# Patient Record
Sex: Male | Born: 1975 | Race: Black or African American | Hispanic: No | Marital: Single | Smoking: Never smoker
Health system: Southern US, Community
[De-identification: ages and names within clinical notes are randomized; demographics above are authoritative.]

---

## 2001-07-22 ENCOUNTER — Emergency Department (HOSPITAL_COMMUNITY): Admission: AC | Admit: 2001-07-22 | Discharge: 2001-07-22 | Payer: Self-pay

## 2002-06-25 ENCOUNTER — Emergency Department (HOSPITAL_COMMUNITY): Admission: EM | Admit: 2002-06-25 | Discharge: 2002-06-25 | Payer: Self-pay | Admitting: Emergency Medicine

## 2002-07-06 ENCOUNTER — Encounter: Payer: Self-pay | Admitting: Emergency Medicine

## 2002-07-06 ENCOUNTER — Emergency Department (HOSPITAL_COMMUNITY): Admission: EM | Admit: 2002-07-06 | Discharge: 2002-07-06 | Payer: Self-pay | Admitting: Emergency Medicine

## 2005-03-30 ENCOUNTER — Emergency Department (HOSPITAL_COMMUNITY): Admission: EM | Admit: 2005-03-30 | Discharge: 2005-03-30 | Payer: Self-pay | Admitting: *Deleted

## 2007-02-07 ENCOUNTER — Emergency Department (HOSPITAL_COMMUNITY): Admission: EM | Admit: 2007-02-07 | Discharge: 2007-02-07 | Payer: Self-pay | Admitting: Emergency Medicine

## 2008-04-30 ENCOUNTER — Emergency Department (HOSPITAL_COMMUNITY): Admission: EM | Admit: 2008-04-30 | Discharge: 2008-04-30 | Payer: Self-pay | Admitting: Emergency Medicine

## 2008-05-24 ENCOUNTER — Emergency Department (HOSPITAL_COMMUNITY): Admission: EM | Admit: 2008-05-24 | Discharge: 2008-05-24 | Payer: Self-pay | Admitting: Emergency Medicine

## 2008-09-21 IMAGING — CR DG KNEE COMPLETE 4+V*L*
4 series · 4 of 4 positions shown · non-contrast
Comparison: 04/30/2008

CLINICAL DATA: Knee pain.

LEFT KNEE - COMPLETE 4+ VIEW

[t knee ap left]
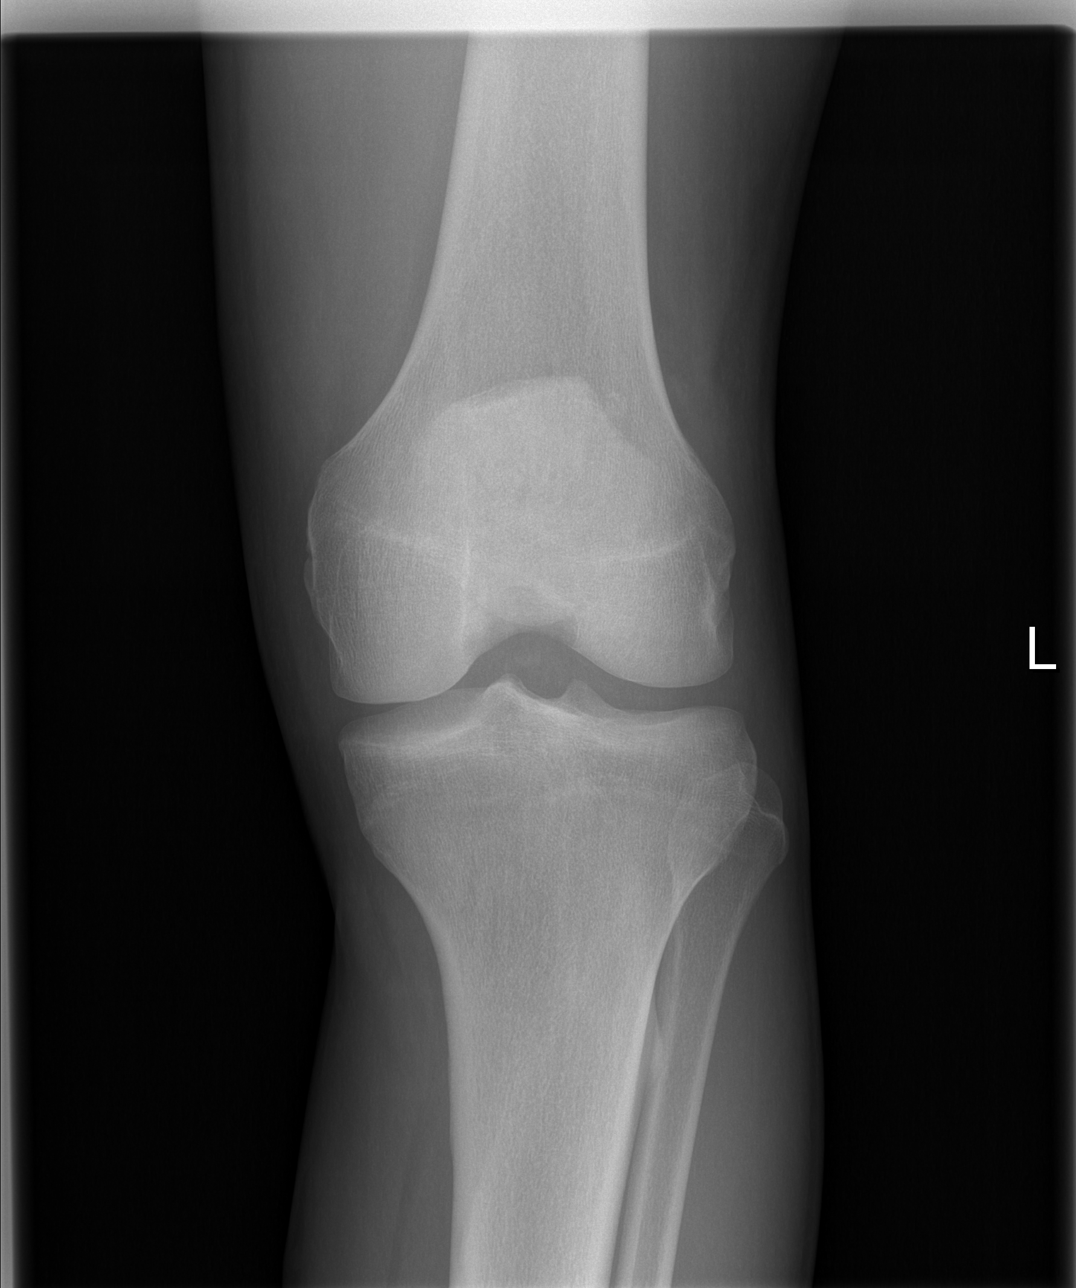

[t knee oblique left (1 of 2)]
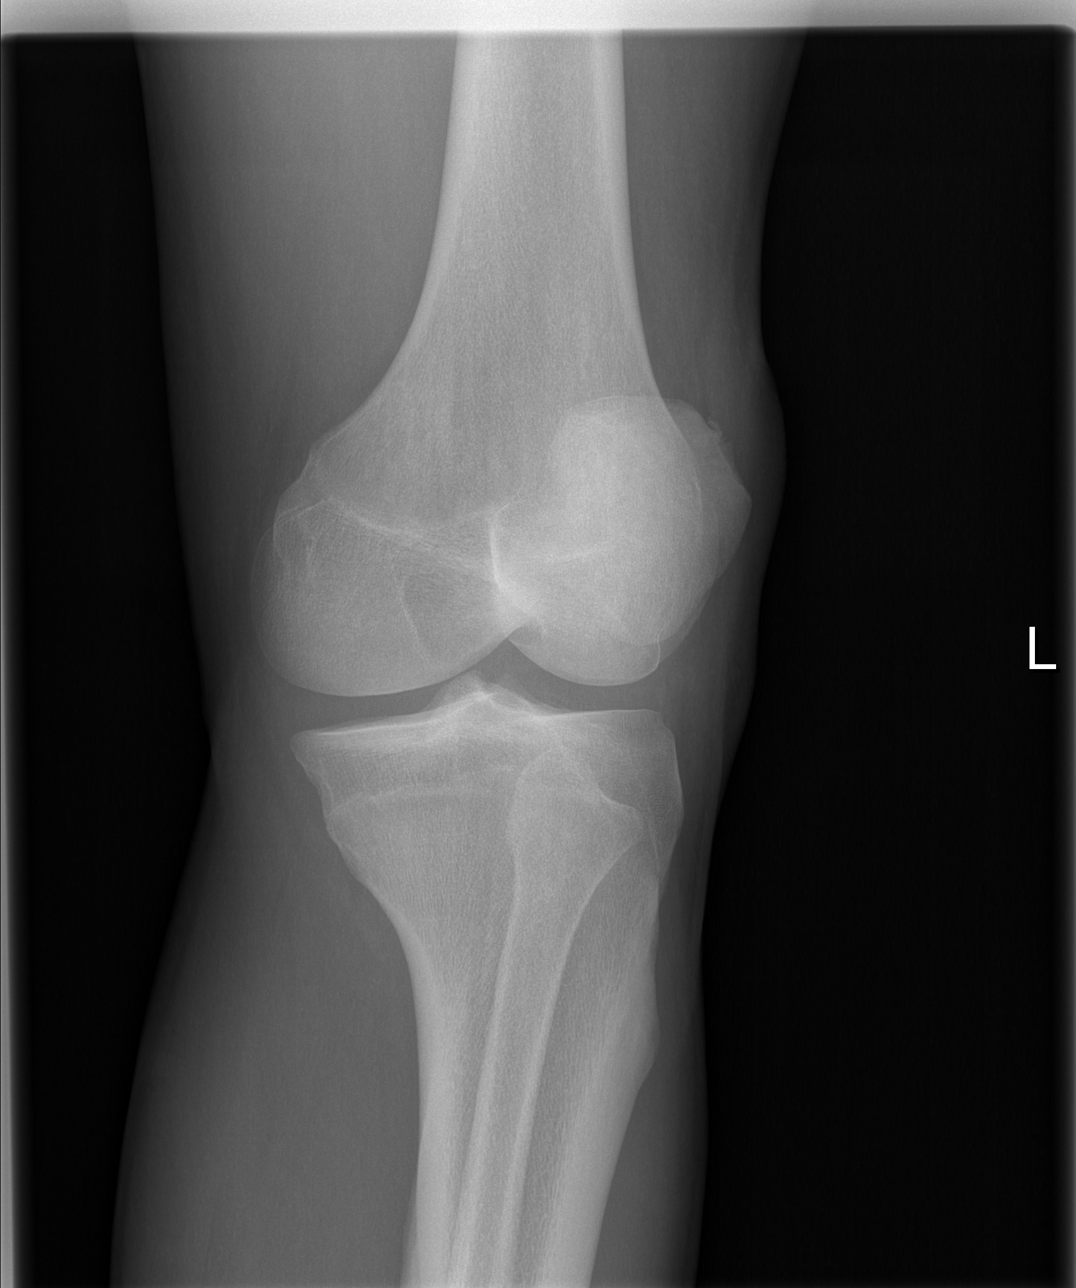

[t knee oblique left (2 of 2)]
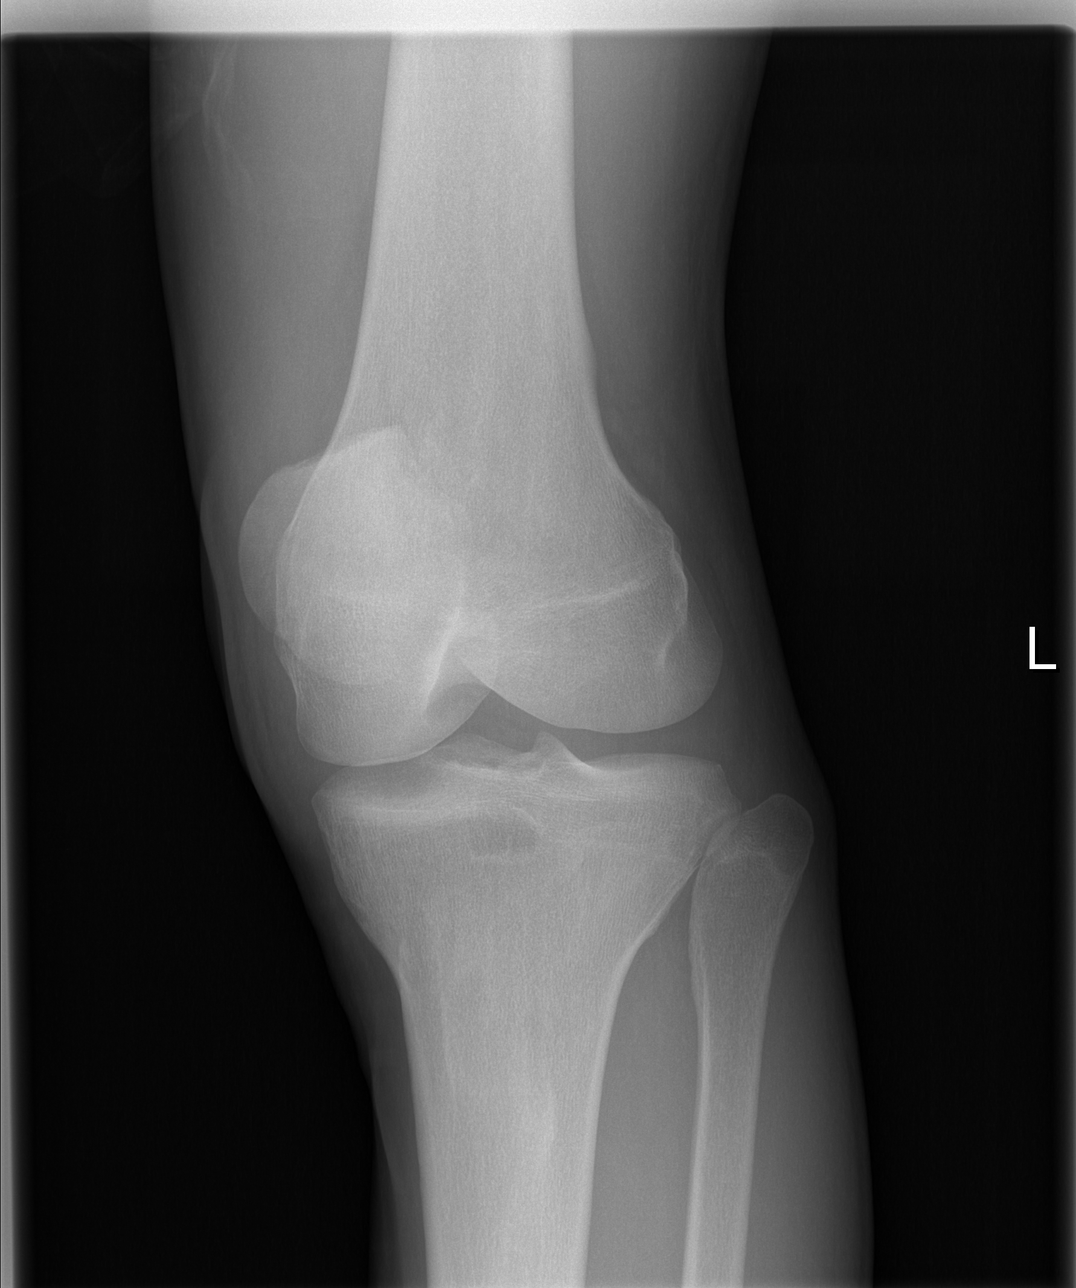

[t knee lat left]
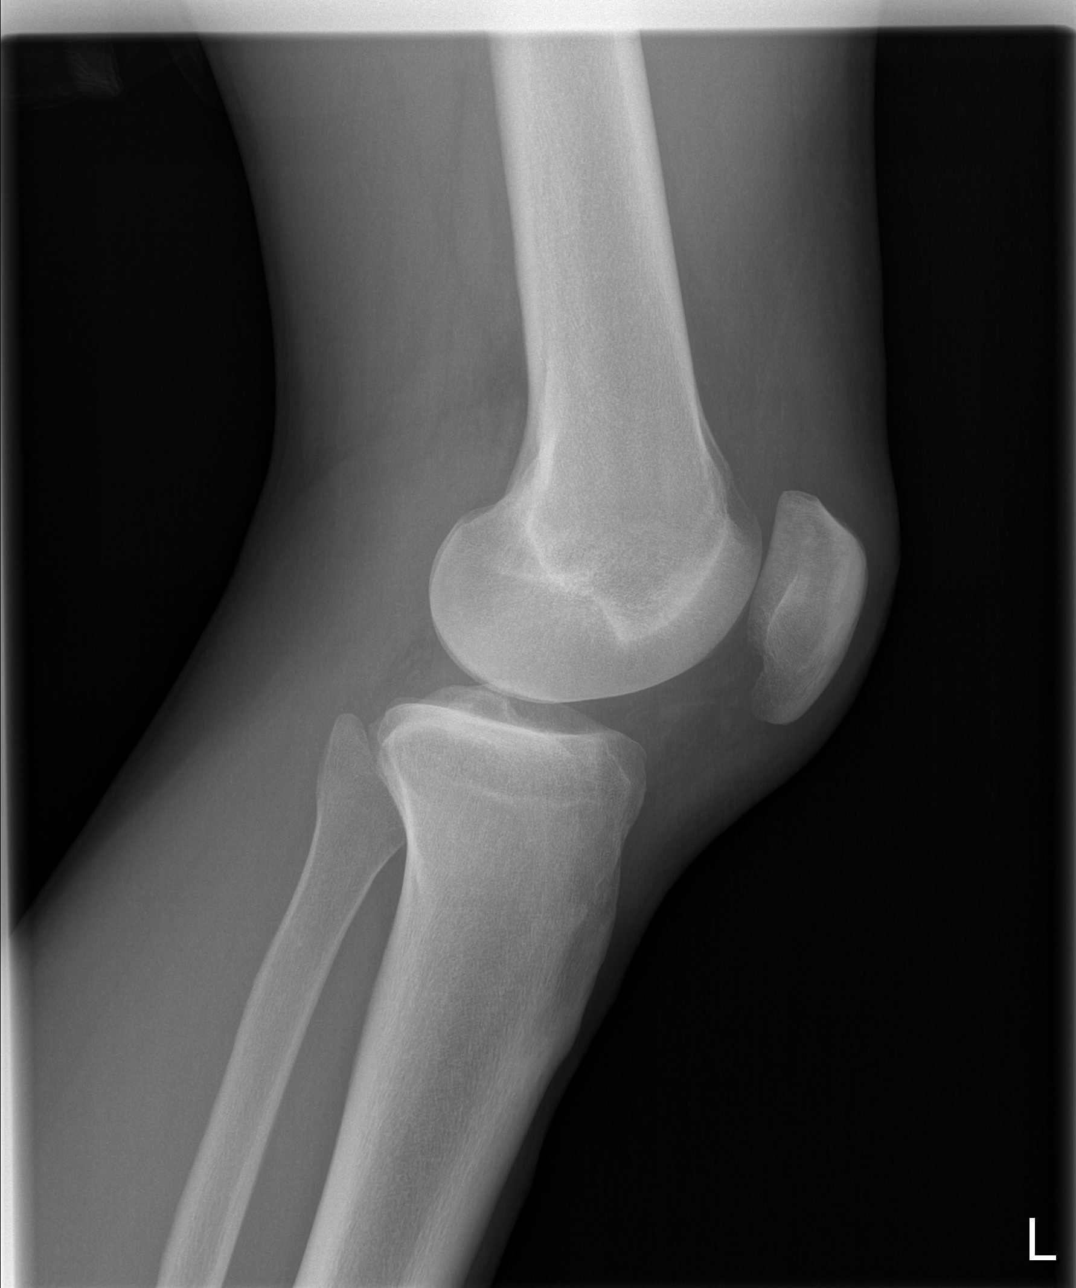

[4 of 4 positions shown; findings below may reference images not displayed]

FINDINGS: Alignment is anatomic.  No fracture.  Small effusion.
IMPRESSION: No evidence of acute osseous injury.  Small effusion.

## 2021-04-25 ENCOUNTER — Other Ambulatory Visit: Payer: Self-pay

## 2021-04-25 ENCOUNTER — Encounter (HOSPITAL_COMMUNITY): Payer: Self-pay | Admitting: *Deleted

## 2021-04-25 ENCOUNTER — Emergency Department (HOSPITAL_COMMUNITY): Payer: Self-pay

## 2021-04-25 ENCOUNTER — Emergency Department (HOSPITAL_COMMUNITY)
Admission: EM | Admit: 2021-04-25 | Discharge: 2021-04-26 | Disposition: A | Discharge status: Home / Self Care | Payer: Self-pay | Attending: Emergency Medicine | Admitting: Emergency Medicine

## 2021-04-25 ENCOUNTER — Encounter (HOSPITAL_COMMUNITY): Payer: Self-pay | Admitting: Emergency Medicine

## 2021-04-25 ENCOUNTER — Ambulatory Visit (HOSPITAL_COMMUNITY)
Admission: EM | Admit: 2021-04-25 | Discharge: 2021-04-25 | Disposition: A | Discharge status: Home / Self Care | Payer: Self-pay

## 2021-04-25 DIAGNOSIS — S0990XA Unspecified injury of head, initial encounter: Secondary | ICD-10-CM

## 2021-04-25 DIAGNOSIS — W228XXA Striking against or struck by other objects, initial encounter: Secondary | ICD-10-CM | POA: Insufficient documentation

## 2021-04-25 DIAGNOSIS — R519 Headache, unspecified: Secondary | ICD-10-CM | POA: Insufficient documentation

## 2021-04-25 DIAGNOSIS — Y9289 Other specified places as the place of occurrence of the external cause: Secondary | ICD-10-CM | POA: Insufficient documentation

## 2021-04-25 DIAGNOSIS — Z5321 Procedure and treatment not carried out due to patient leaving prior to being seen by health care provider: Secondary | ICD-10-CM | POA: Insufficient documentation

## 2021-04-25 LAB — CBC WITH DIFFERENTIAL/PLATELET
Abs Immature Granulocytes: 0.03 10*3/uL (ref 0.00–0.07)
Basophils Absolute: 0.1 10*3/uL (ref 0.0–0.1)
Basophils Relative: 1 %
Eosinophils Absolute: 0.5 10*3/uL (ref 0.0–0.5)
Eosinophils Relative: 7 %
HCT: 41.9 % (ref 39.0–52.0)
Hemoglobin: 14.5 g/dL (ref 13.0–17.0)
Immature Granulocytes: 1 %
Lymphocytes Relative: 17 %
Lymphs Abs: 1.1 10*3/uL (ref 0.7–4.0)
MCH: 27.9 pg (ref 26.0–34.0)
MCHC: 34.6 g/dL (ref 30.0–36.0)
MCV: 80.6 fL (ref 80.0–100.0)
Monocytes Absolute: 0.5 10*3/uL (ref 0.1–1.0)
Monocytes Relative: 7 %
Neutro Abs: 4.4 10*3/uL (ref 1.7–7.7)
Neutrophils Relative %: 67 %
Platelets: 249 10*3/uL (ref 150–400)
RBC: 5.2 MIL/uL (ref 4.22–5.81)
RDW: 13.3 % (ref 11.5–15.5)
WBC: 6.5 10*3/uL (ref 4.0–10.5)
nRBC: 0 % (ref 0.0–0.2)

## 2021-04-25 LAB — BASIC METABOLIC PANEL
Anion gap: 6 (ref 5–15)
BUN: 13 mg/dL (ref 6–20)
CO2: 25 mmol/L (ref 22–32)
Calcium: 8.9 mg/dL (ref 8.9–10.3)
Chloride: 107 mmol/L (ref 98–111)
Creatinine, Ser: 1.28 mg/dL — ABNORMAL HIGH (ref 0.61–1.24)
GFR, Estimated: >60 mL/min (ref >60)
Glucose, Bld: 88 mg/dL (ref 70–99)
Potassium: 3.9 mmol/L (ref 3.5–5.1)
Sodium: 138 mmol/L (ref 135–145)

## 2021-04-25 NOTE — ED Notes (Signed)
Pt walked through doors to green zone and asked how long it would be to get his results. Pt informed of longest wait and redirected back out to the lobby.
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
